# Patient Record
Sex: Female | Born: 1990 | Race: Black or African American | Hispanic: No | Marital: Single | Smoking: Never smoker
Health system: Southern US, Community
[De-identification: ages and names within clinical notes are randomized; demographics above are authoritative.]

---
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
## 2010-01-27 ENCOUNTER — Inpatient Hospital Stay (HOSPITAL_COMMUNITY): Admission: AD | Admit: 2010-01-27 | Discharge: 2010-01-27 | Payer: Self-pay | Admitting: Obstetrics and Gynecology

## 2010-01-29 ENCOUNTER — Inpatient Hospital Stay (HOSPITAL_COMMUNITY): Admission: AD | Admit: 2010-01-29 | Discharge: 2010-01-29 | Payer: Self-pay | Admitting: Obstetrics and Gynecology

## 2010-06-03 LAB — URINALYSIS, ROUTINE W REFLEX MICROSCOPIC
Bilirubin Urine: NEGATIVE
Hgb urine dipstick: NEGATIVE
Ketones, ur: NEGATIVE mg/dL
Protein, ur: NEGATIVE mg/dL
Urobilinogen, UA: 0.2 mg/dL (ref 0.0–1.0)

## 2010-06-03 LAB — ABO/RH: ABO/RH(D): O POS

## 2010-06-08 LAB — URINALYSIS, ROUTINE W REFLEX MICROSCOPIC
Nitrite: NEGATIVE
Specific Gravity, Urine: >1.03 — ABNORMAL HIGH (ref 1.005–1.030)
pH: 5.5 (ref 5.0–8.0)

## 2010-06-08 LAB — GC/CHLAMYDIA PROBE AMP, GENITAL
Chlamydia, DNA Probe: NEGATIVE
GC Probe Amp, Genital: NEGATIVE

## 2010-07-04 ENCOUNTER — Emergency Department (HOSPITAL_COMMUNITY): Payer: Federal, State, Local not specified - PPO

## 2010-07-04 ENCOUNTER — Emergency Department (HOSPITAL_COMMUNITY)
Admission: EM | Admit: 2010-07-04 | Discharge: 2010-07-04 | Disposition: A | Discharge status: Home / Self Care | Payer: Federal, State, Local not specified - PPO | Attending: Emergency Medicine | Admitting: Emergency Medicine

## 2010-07-04 DIAGNOSIS — J40 Bronchitis, not specified as acute or chronic: Secondary | ICD-10-CM | POA: Insufficient documentation

## 2010-07-04 DIAGNOSIS — R05 Cough: Secondary | ICD-10-CM | POA: Insufficient documentation

## 2010-07-04 DIAGNOSIS — R059 Cough, unspecified: Secondary | ICD-10-CM | POA: Insufficient documentation

## 2010-07-04 DIAGNOSIS — R0602 Shortness of breath: Secondary | ICD-10-CM | POA: Insufficient documentation

## 2010-07-04 DIAGNOSIS — R062 Wheezing: Secondary | ICD-10-CM | POA: Insufficient documentation

## 2010-11-17 LAB — OB RESULTS CONSOLE GC/CHLAMYDIA: Gonorrhea: NEGATIVE

## 2011-02-09 LAB — OB RESULTS CONSOLE ABO/RH: RH Type: POSITIVE

## 2011-02-09 LAB — OB RESULTS CONSOLE ANTIBODY SCREEN: Antibody Screen: NEGATIVE

## 2011-02-09 LAB — OB RESULTS CONSOLE HEPATITIS B SURFACE ANTIGEN: Hepatitis B Surface Ag: NEGATIVE

## 2011-03-24 NOTE — L&D Delivery Note (Signed)
Delivery Note At 1:02 AM a viable and healthy female was delivered via Vaginal, Spontaneous Delivery (Presentation: Right Occiput Anterior).  APGAR: 7, 9; weight .  6lb 13 oz Placenta status: Intact, Spontaneous Pathology for maternal temp  Cord: 3 vessels with the following complications: None.  Cord pH: none  Anesthesia: Epidural  Episiotomy: None Lacerations: None Suture Repair: n/a Est. Blood Loss (mL): 300  Mom to postpartum.  Baby to nursery-stable.  Lucia Harm A 12/09/2011, 1:55 AM

## 2011-05-11 ENCOUNTER — Emergency Department (INDEPENDENT_AMBULATORY_CARE_PROVIDER_SITE_OTHER)
Admission: EM | Admit: 2011-05-11 | Discharge: 2011-05-11 | Disposition: A | Discharge status: Home / Self Care | Payer: Federal, State, Local not specified - PPO | Source: Home / Self Care | Attending: Emergency Medicine | Admitting: Emergency Medicine

## 2011-05-11 ENCOUNTER — Encounter (HOSPITAL_COMMUNITY): Payer: Self-pay | Admitting: Emergency Medicine

## 2011-05-11 DIAGNOSIS — J069 Acute upper respiratory infection, unspecified: Secondary | ICD-10-CM

## 2011-05-11 DIAGNOSIS — J329 Chronic sinusitis, unspecified: Secondary | ICD-10-CM

## 2011-05-11 MED ORDER — AMOXICILLIN 500 MG PO CAPS: 1000.0000 mg | ORAL_CAPSULE | Freq: Three times a day (TID) | ORAL | Status: DC

## 2011-05-11 NOTE — ED Provider Notes (Signed)
Chief Complaint  Patient presents with  . Facial Pain  . Sinusitis    History of Present Illness:   The patient is a 21 year old female who is 2 months pregnant. She has had a five-day history of feeling achy all over and sore throat. This was then followed by and nasal congestion with clear drainage, facial, sinus, and tooth pain, and a dry cough. She denies fevers, chills, or sweats. No earache. No shortness of breath, chest pain, or difficulty breathing. She has not had any complications of her pregnancy.  Review of Systems:  Other than noted above, the patient denies any of the following symptoms. Systemic:  No fever, chills, sweats, fatigue, myalgias, headache, or anorexia. Eye:  No redness, pain or drainage. ENT:  No earache, nasal congestion, rhinorrhea, sinus pressure, or sore throat. Lungs:  No cough, sputum production, wheezing, shortness of breath. Or chest pain. GI:  No nausea, vomiting, abdominal pain or diarrhea. Skin:  No rash or itching.  PMFSH:  Past medical history, family history, social history, meds, and allergies were reviewed.  Physical Exam:   Vital signs:  BP 114/69  Pulse 98  Temp(Src) 98.3 F (36.8 C) (Oral)  Resp 14  SpO2 100% General:  Alert, in no distress. Eye:  No conjunctival injection or drainage. ENT:  TMs and canals were normal, without erythema or inflammation.  Nasal mucosa was clear and uncongested, without drainage.  Mucous membranes were moist.  Pharynx was clear, without exudate or drainage.  There were no oral ulcerations or lesions. Neck:  Supple, no adenopathy, tenderness or mass. Lungs:  No respiratory distress.  Lungs were clear to auscultation, without wheezes, rales or rhonchi.  Breath sounds were clear and equal bilaterally. Heart:  Regular rhythm, without gallops, murmers or rubs. Skin:  Clear, warm, and dry, without rash or lesions.  Radiology:  No results found.  Assessment:   Diagnoses that have been ruled out:  None    Diagnoses that are still under consideration:  None  Final diagnoses:  Upper respiratory infection  Sinusitis      Plan:   1.  The following meds were prescribed:   New Prescriptions   AMOXICILLIN (AMOXIL) 500 MG CAPSULE    Take 2 capsules (1,000 mg total) by mouth 3 (three) times daily.   2.  The patient was instructed in symptomatic care and handouts were given. 3.  The patient was told to return if becoming worse in any way, if no better in 3 or 4 days, and given some red flag symptoms that would indicate earlier return.   Roque Lias, MD 05/11/11 [phone]

## 2011-05-11 NOTE — ED Notes (Signed)
PT HERE WITH SINUS INFECTION  FACIAL PAIN ,PRESSURE,THROB H/A AND NASAL CONGESTION THAT STARTED LAST THURSDAY.PT HAS TAKEN TAMIFLU BUT NO SINUS MEDS.PT IS [redacted] WEEKS PREGNANT AND TAKE PRENATAL VITAMINS ONLY

## 2011-05-11 NOTE — Discharge Instructions (Signed)
May take Afrin Spray and Claritin tablets (over the counter).Saline Nose Drops  To help clear a stuffy nose, put salt water (saline) nose drops in your infant's nose. This helps to loosen the secretions in the nose. Use a bulb syringe to clean the nose out:  Before feeding.   Before putting your infant down for naps.   No more than once every 3 hours to avoid irritating your infant's nostrils.  HOME CARE  Buy nose drops at your local drug store. You can also make nose drops yourself. Mix 1 cup of water with  teaspoon of salt. Stir. Store this mixture at room temperature. Make a new batch daily.   To use the drops:   Put 1 or 2 drops in each side of infant's nose with a clean medicine dropper. Do not use this dropper for any other medicine.   Squeeze the air out of the suction bulb before inserting it into your infant's nose.   While still squeezing the bulb flat, place the tip of the bulb into a nostril. Let air come back into the bulb. The suction will pull snot out of the nose and into the bulb.   Repeat on other nostril.   Squeeze the bulb several times into a tissue and wash the bulb tip in soapy water. Store the bulb with the tip side down on paper towel.   Use the bulb syringe with only the saline drops to avoid irritating your infant's nostrils.  GET HELP RIGHT AWAY IF:  The snot changes to green or yellow.   The snot gets thicker.   Your infant is 3 months or younger with a rectal temperature of 100.4 F (38 C) or higher.   Your infant is older than 3 months with a rectal temperature of 102 F (38.9 C) or higher.   The stuffy nose lasts 10 days or longer.   There is trouble breathing or feeding.  MAKE SURE YOU:  Understand these instructions.   Will watch your infant's condition.   Will get help right away if your infant is not doing well or gets worse.  Document Released: 01/04/2009 Document Revised: 11/19/2010 Document Reviewed: 01/04/2009 [facility] Patient  Information 2012 [place], [place].

## 2011-05-19 ENCOUNTER — Encounter (HOSPITAL_COMMUNITY): Payer: Self-pay

## 2011-05-19 ENCOUNTER — Inpatient Hospital Stay (HOSPITAL_COMMUNITY)
Admission: AD | Admit: 2011-05-19 | Discharge: 2011-05-19 | Disposition: A | Discharge status: Home Health | Payer: Federal, State, Local not specified - PPO | Source: Ambulatory Visit | Attending: Obstetrics and Gynecology | Admitting: Obstetrics and Gynecology

## 2011-05-19 DIAGNOSIS — O200 Threatened abortion: Secondary | ICD-10-CM | POA: Insufficient documentation

## 2011-05-19 NOTE — Progress Notes (Signed)
Patient is here with c/o Bhalla vaginal discharge that she notices after bowel movement. She states that the blood isn't from the rectum. She was evaluated about a week ago and was told that she is okay. She denies any pain. Her first ob appt is Thursday.

## 2011-05-19 NOTE — ED Provider Notes (Signed)
History   21 yo G2P0010 BF now @ [redacted] wk gestation presents for evaluation of Staszewski vaginal discharge. Pt denies abd cramping. Seen ion [place] last thur and had sonogram which showed some separation but c/w gestational age  Chief Complaint  Patient presents with  . Vaginal Bleeding     OB History    Grav Para Term Preterm Abortions TAB SAB Ect Mult Living   2    1  1          History reviewed. No pertinent past medical history.  History reviewed. No pertinent past surgical history.  History reviewed. No pertinent family history.  History  Substance Use Topics  . Smoking status: Never Smoker   . Smokeless tobacco: Not on file  . Alcohol Use: No    Allergies: No Known Allergies  Prescriptions prior to admission  Medication Sig Dispense Refill  . Prenatal Vit-Fe Fumarate-FA (PRENATAL MULTIVITAMIN) TABS Take 1 tablet by mouth daily.         Physical Exam   Blood pressure 122/64, pulse 101, temperature 97.1 F (36.2 C), temperature source Oral, resp. rate 16, height 5\' 11"  (1.803 m), weight 153 lb 6 oz (69.57 kg).  General appearance: alert, cooperative and no distress Abdomen: soft, non-tender; bowel sounds normal; no masses,  no organomegaly Pelvic: 10 wk AV, cervix firm closed/long. scant pinkish discharge Extremities: no edema, redness or tenderness in the calves or thighs  Bedside sono: (+) active fetus z(+) FHr doppler 185   ED Course  A/P) Threatened ab  P) threatened Ab prec. Keep OB appt thur. Abstain from intercourse MDM   Latora Quarry A, MD 6:27 PM 05/19/2011      21 yo G2P00

## 2011-06-16 IMAGING — US US OB COMP LESS 14 WK
1 series · 14 of 28 positions shown · non-contrast
Comparison: None.

CLINICAL DATA: Pelvic pain

OBSTETRIC <14 WK US AND TRANSVAGINAL OB US
TECHNIQUE: Both transabdominal and transvaginal ultrasound
examinations were performed for complete evaluation of the
gestation as well as the maternal uterus, adnexal regions, and
pelvic cul-de-sac.  Transvaginal technique was performed to assess
early pregnancy.

[Series 1: us ob comp less 14 wks · 0.20mm/px · 14 of 43 slices shown]
[im 2/43]
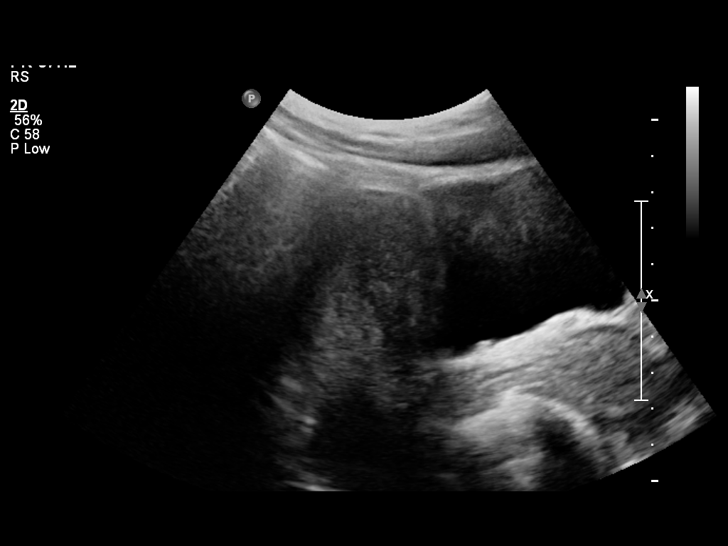
[im 5/43]
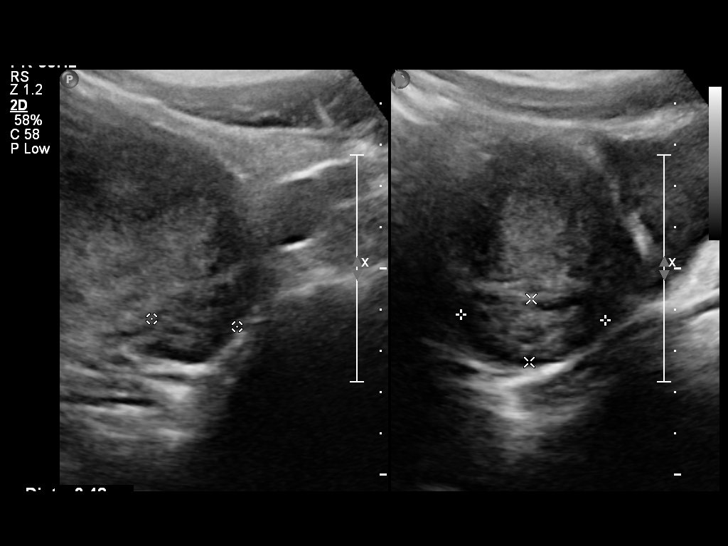
[im 8/43]
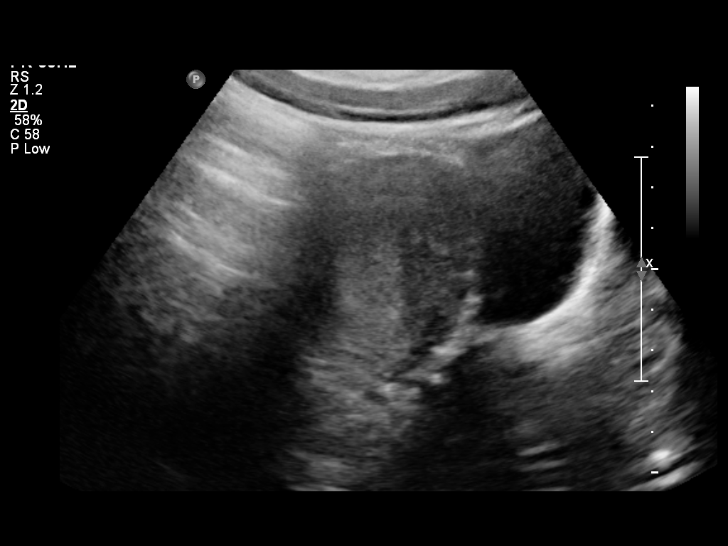
[im 11/43]
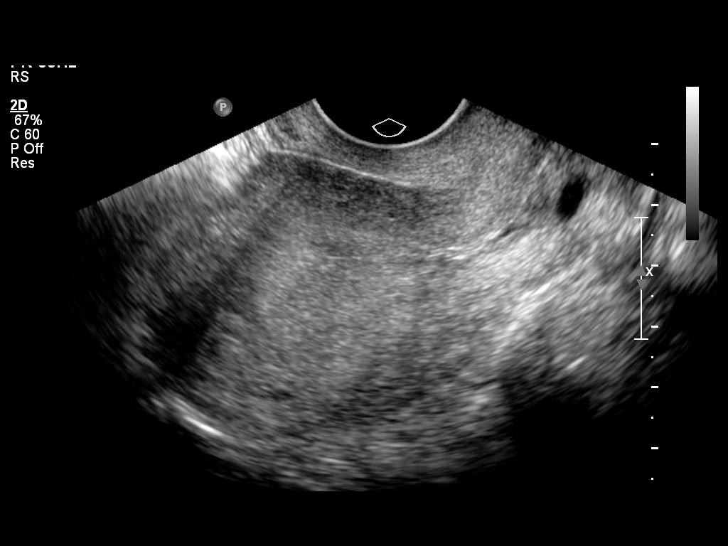
[im 15/43]
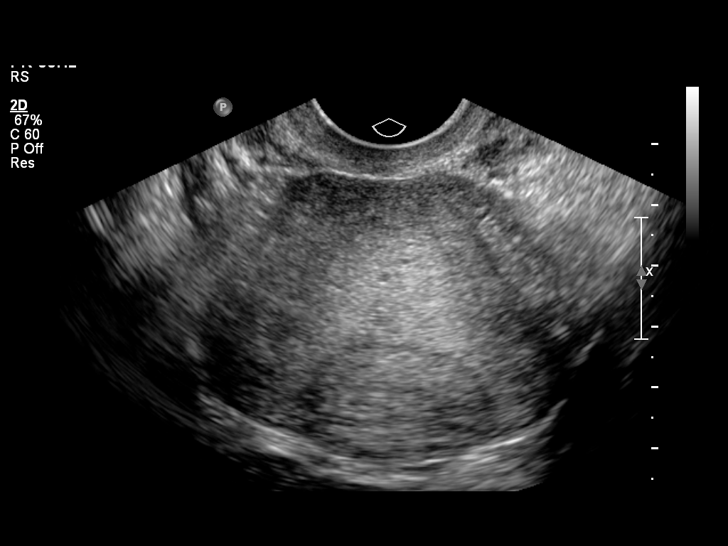
[im 18/43]
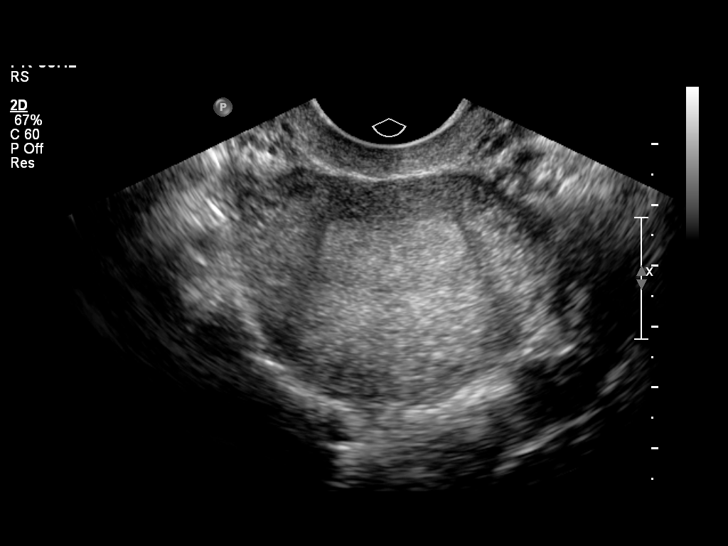
[im 21/43]
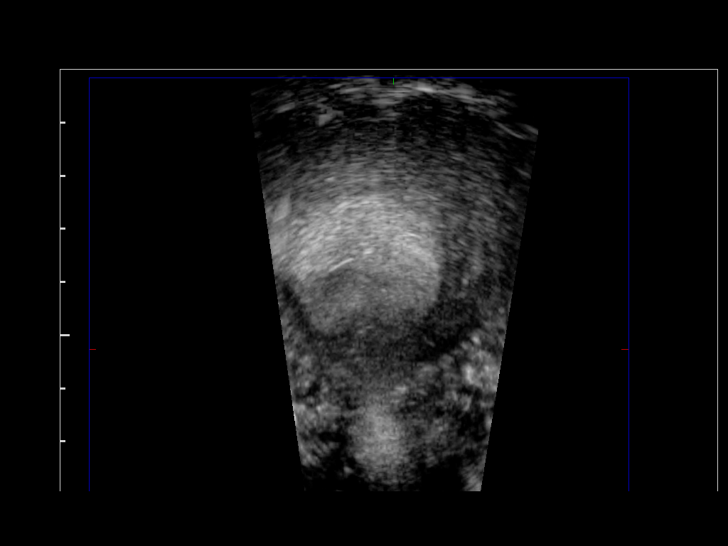
[im 24/43]
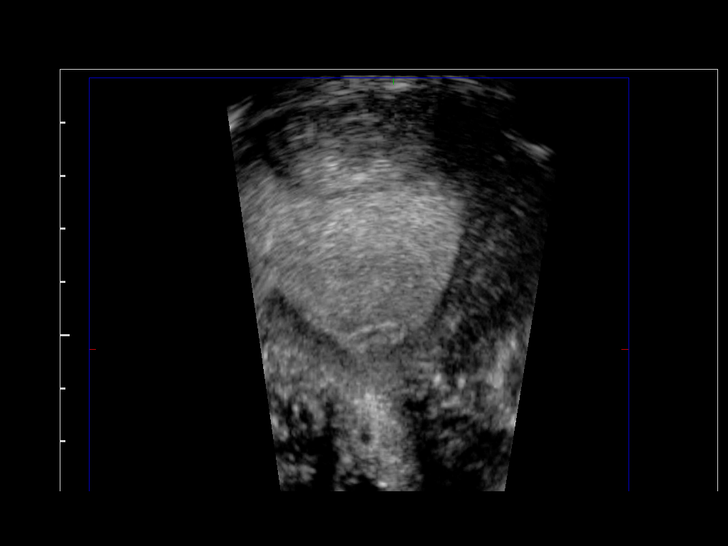
[im 27/43]
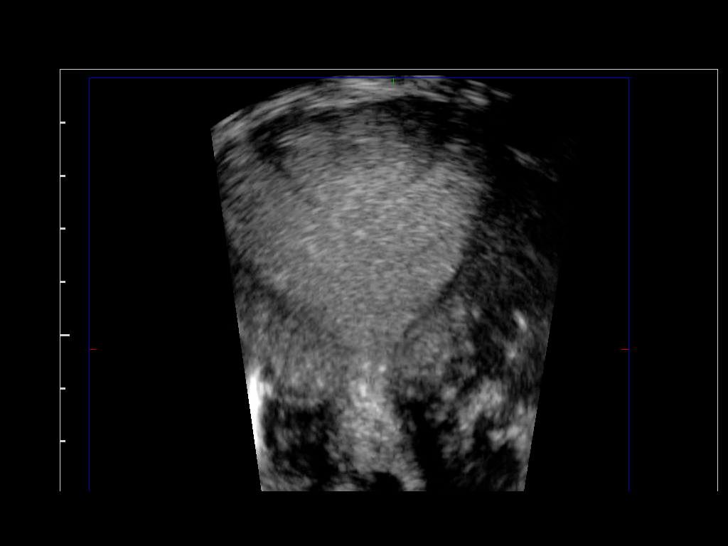
[im 30/43]
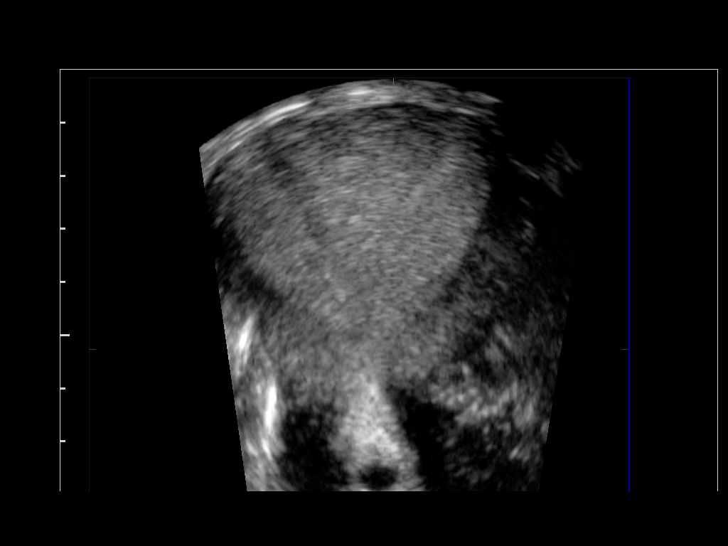
[im 33/43]
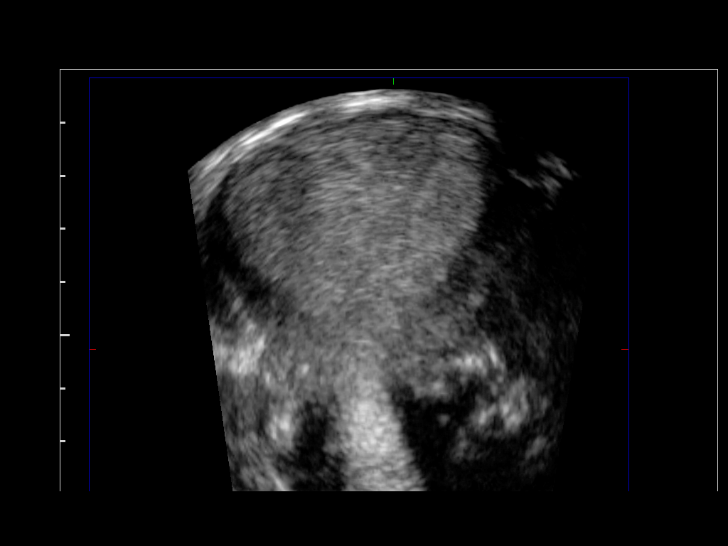
[im 36/43]
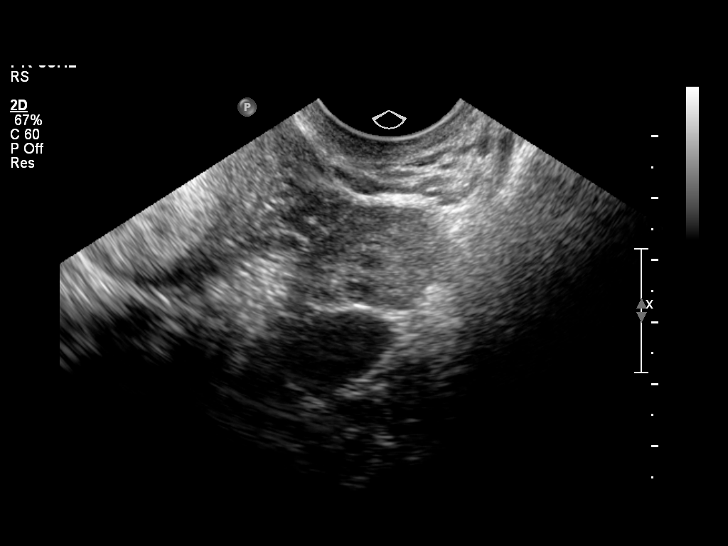
[im 39/43]
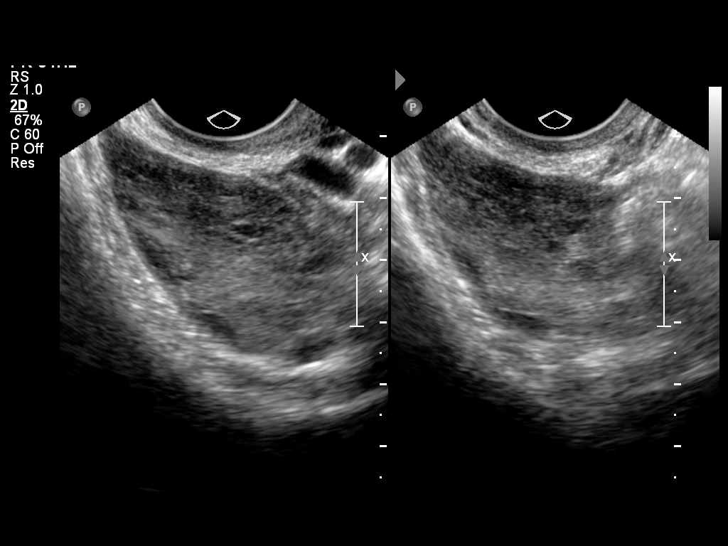
[im 43/43]
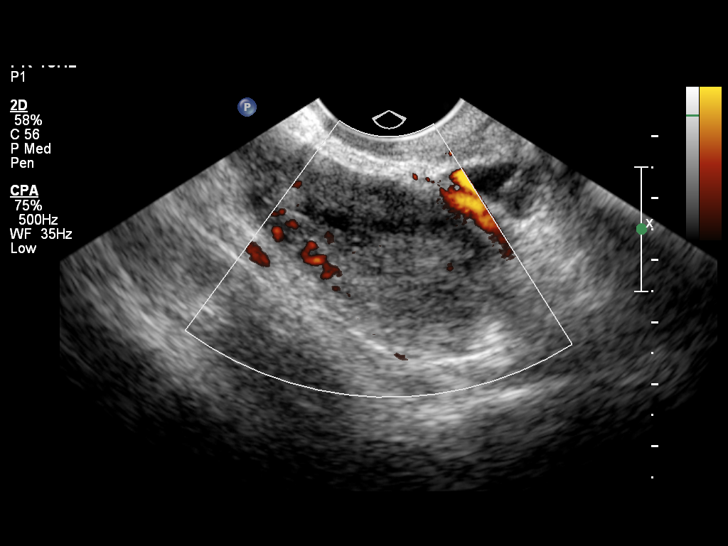

[14 of 28 positions shown; findings below may reference images not displayed]

Intrauterine gestational sac:  Absent
Yolk sac: Absent
Embryo: Absent
Cardiac Activity: Not applicable
Heart Rate: Not applicable bpm

Maternal uterus/adnexae:
Ovaries are within normal limits.  Septate uterus is noted on
coronal imaging.  No free fluid.  No pelvic mass.
IMPRESSION: No intrauterine gestational sac is identified.  Differential
diagnosis includes normal early pregnancy, spontaneous abortion,
and ectopic pregnancy.  Serial beta HCG levels are recommended.

Septate uterus.

## 2011-11-21 IMAGING — CR DG CHEST 2V
2 series · 2 of 2 positions shown · non-contrast
Comparison: None.

CLINICAL DATA: Shortness of breath.

CHEST - 2 VIEW

[w chest pa]
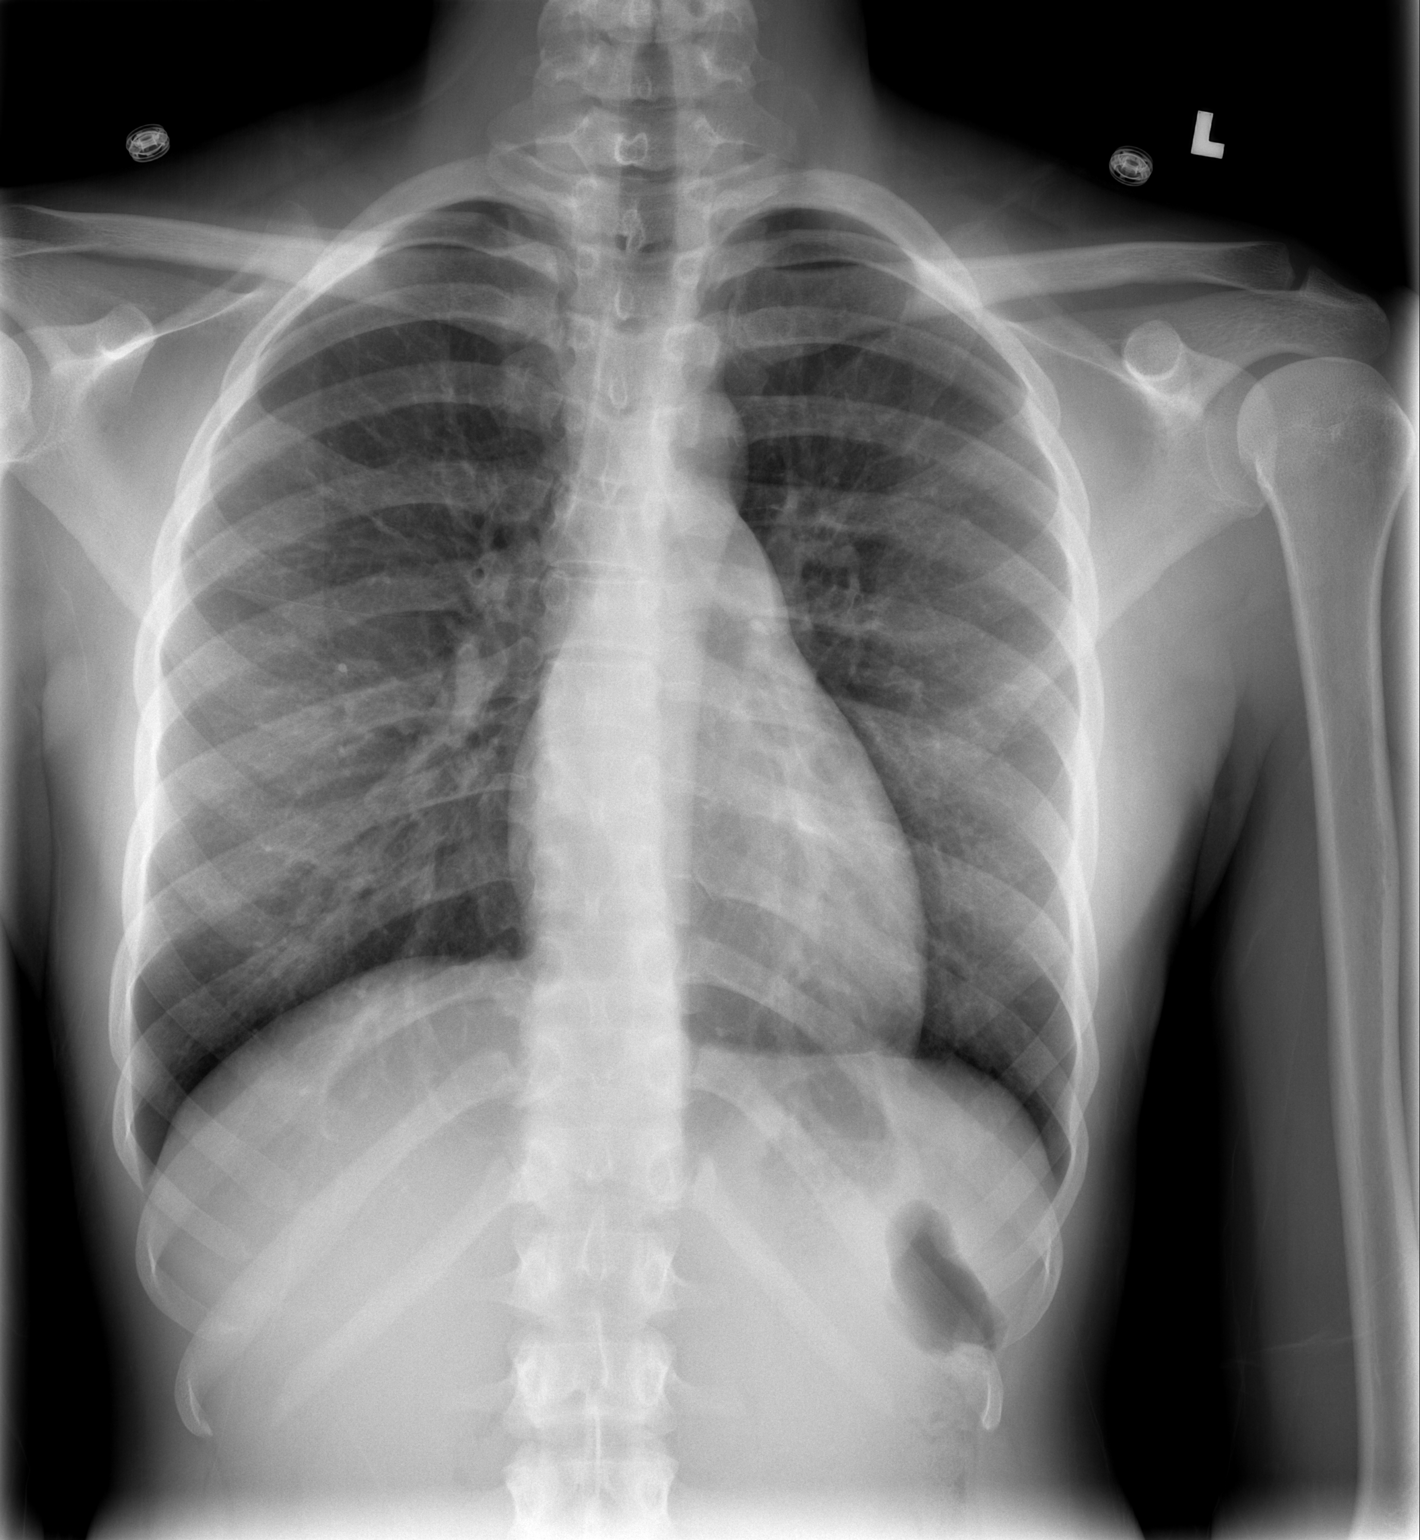

[w chest lat]
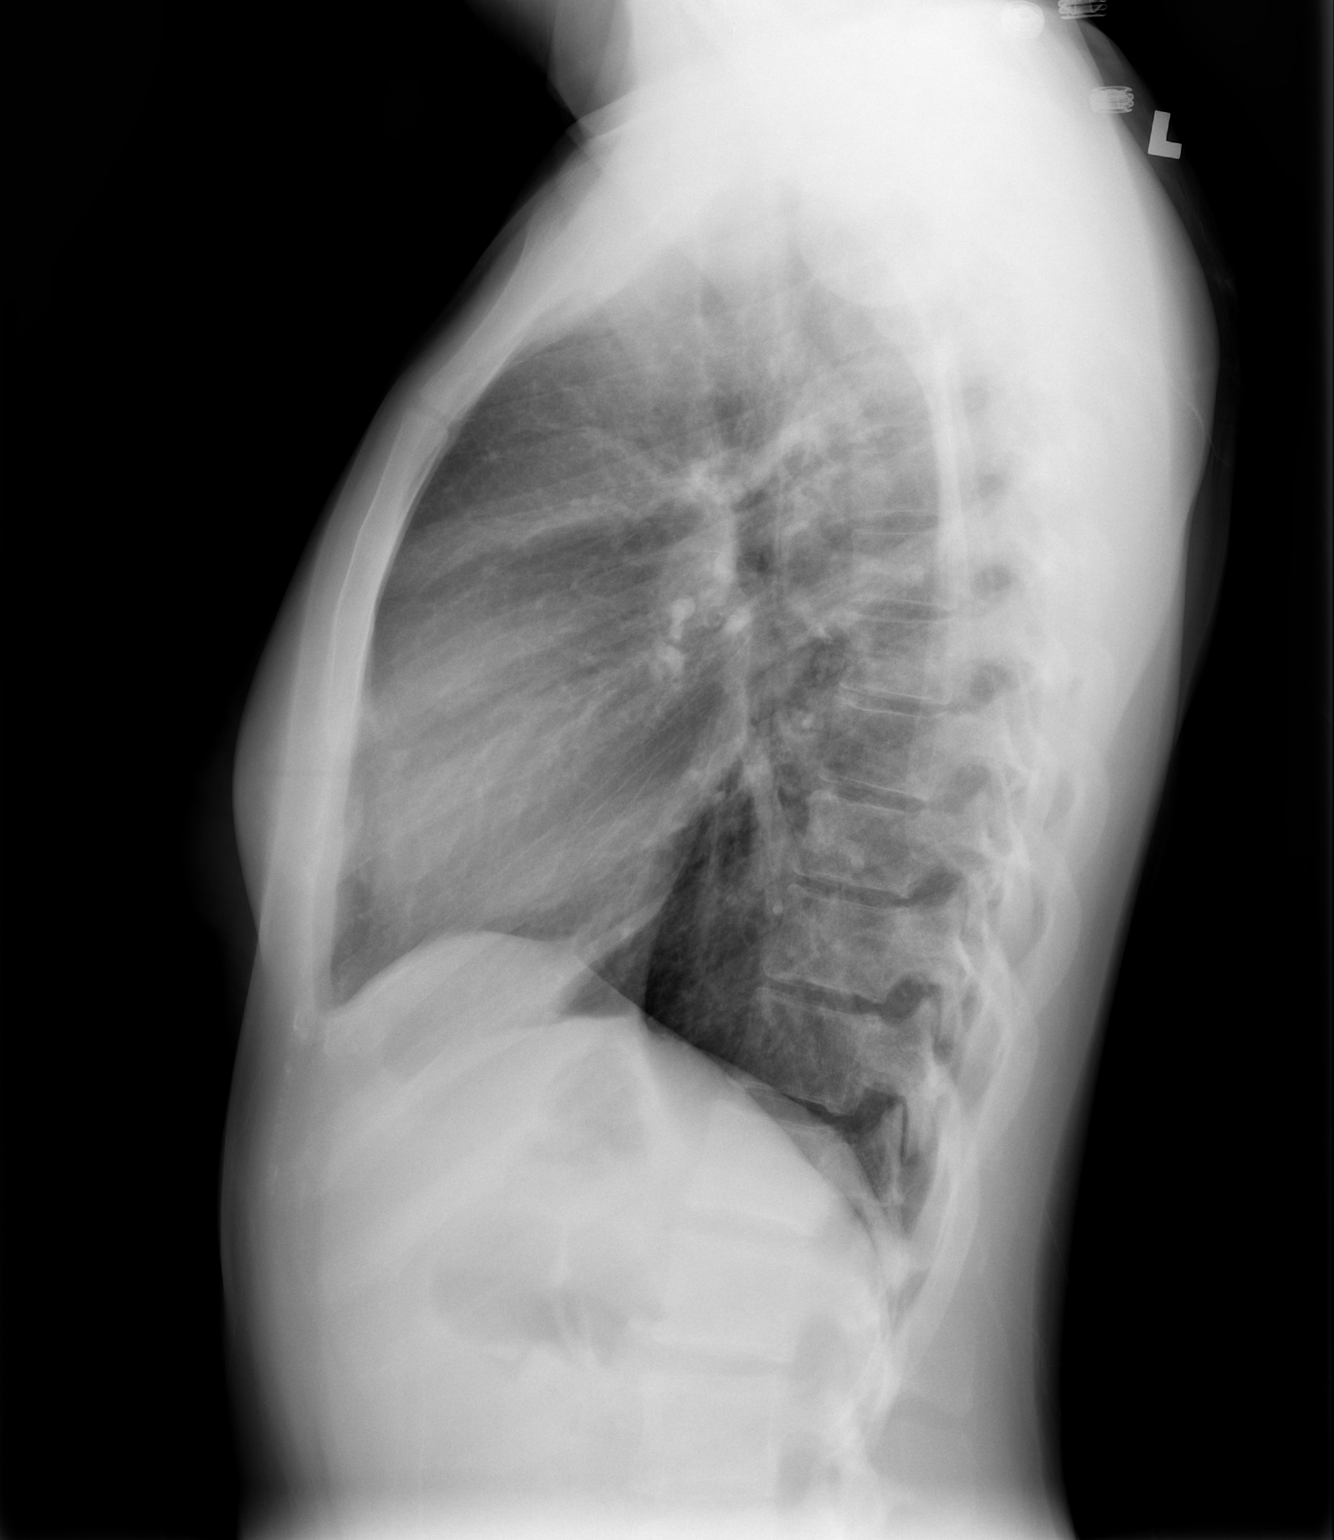

[2 of 2 positions shown; findings below may reference images not displayed]

FINDINGS: The lungs are well-aerated.  Mild peribronchial
thickening is noted.  There is no evidence of focal opacification,
pleural effusion or pneumothorax.

The heart is normal in size; the mediastinal contour is within
normal limits.  No acute osseous abnormalities are seen.
IMPRESSION: Mild peribronchial thickening noted; lungs otherwise clear.

## 2011-12-06 ENCOUNTER — Inpatient Hospital Stay (HOSPITAL_COMMUNITY)
Admission: AD | Admit: 2011-12-06 | Discharge: 2011-12-06 | Disposition: A | Discharge status: Home / Self Care | Payer: Federal, State, Local not specified - PPO | Source: Ambulatory Visit | Attending: Obstetrics and Gynecology | Admitting: Obstetrics and Gynecology

## 2011-12-06 ENCOUNTER — Encounter (HOSPITAL_COMMUNITY): Payer: Self-pay | Admitting: *Deleted

## 2011-12-06 DIAGNOSIS — O479 False labor, unspecified: Secondary | ICD-10-CM | POA: Insufficient documentation

## 2011-12-06 NOTE — MAU Provider Note (Signed)
History     Chief Complaint  Patient presents with  . Contractions    21 yo G1P0 SBF now @ 38 5/7 weeks presents for evaluation of ctx. Intact membrane  OB History    Grav Para Term Preterm Abortions TAB SAB Ect Mult Living   2    1  1    0      History reviewed. No pertinent past medical history.  History reviewed. No pertinent past surgical history.  History reviewed. No pertinent family history.  History  Substance Use Topics  . Smoking status: Never Smoker   . Smokeless tobacco: Not on file  . Alcohol Use: No    Allergies: No Known Allergies  Prescriptions prior to admission  Medication Sig Dispense Refill  . Prenatal Vit-Fe Fumarate-FA (PRENATAL MULTIVITAMIN) TABS Take 1 tablet by mouth daily.       Tracing reactive baseline  Physical Exam   Blood pressure 125/80, pulse 70, temperature 97.5 F (36.4 C), temperature source Oral, resp. rate 20, height 5\' 11"  (1.803 m), weight 83.008 kg (183 lb).  Abdomen: gravid nontender Pelvic: external genitalia normal and posterior/ 50%/pp oop/ext os FT ED Course   MDM No evidence of labor IUP @ 38 5/7 week P) d/c home. Labor prec f/u 9/17  Ryzen Deady A, MD 8:41 AM 12/06/2011

## 2011-12-06 NOTE — MAU Note (Signed)
PT SAYS  STARTED HURTING  AT [phone]  WAS FT IN OFFICE ON Tuesday.  DENIES HSV AND MRSA

## 2011-12-06 NOTE — MAU Note (Signed)
I've had regular contractions since this morning. Started timing them at 0450. No leaking or bleeding

## 2011-12-06 NOTE — Progress Notes (Signed)
Dr. Cherly Hensen notified of cervical exam performed by Debra RN, fingertip- difficult exam due to patients comfort level. Dr. Cherly Hensen on her way in to see patient and examine her.

## 2011-12-08 ENCOUNTER — Encounter (HOSPITAL_COMMUNITY): Payer: Self-pay

## 2011-12-08 ENCOUNTER — Inpatient Hospital Stay (HOSPITAL_COMMUNITY)
Admission: AD | Admit: 2011-12-08 | Discharge: 2011-12-10 | DRG: 372 | Disposition: A | Discharge status: Home / Self Care | Payer: Federal, State, Local not specified - PPO | Source: Ambulatory Visit | Attending: Obstetrics and Gynecology | Admitting: Obstetrics and Gynecology

## 2011-12-08 ENCOUNTER — Encounter (HOSPITAL_COMMUNITY): Payer: Self-pay | Admitting: Anesthesiology

## 2011-12-08 ENCOUNTER — Inpatient Hospital Stay (HOSPITAL_COMMUNITY): Payer: Federal, State, Local not specified - PPO | Admitting: Anesthesiology

## 2011-12-08 DIAGNOSIS — O324XX0 Maternal care for high head at term, not applicable or unspecified: Secondary | ICD-10-CM | POA: Diagnosis present

## 2011-12-08 DIAGNOSIS — O9903 Anemia complicating the puerperium: Secondary | ICD-10-CM | POA: Diagnosis not present

## 2011-12-08 DIAGNOSIS — Z23 Encounter for immunization: Secondary | ICD-10-CM

## 2011-12-08 DIAGNOSIS — D62 Acute posthemorrhagic anemia: Secondary | ICD-10-CM | POA: Diagnosis not present

## 2011-12-08 LAB — CBC
HCT: 38.4 % (ref 36.0–46.0)
MCV: 85 fL (ref 78.0–100.0)
RBC: 4.52 MIL/uL (ref 3.87–5.11)
WBC: 8.3 10*3/uL (ref 4.0–10.5)

## 2011-12-08 MED ORDER — EPHEDRINE 5 MG/ML INJ
10.0000 mg | INTRAVENOUS | Status: DC | PRN
  Filled 2011-12-08: qty 4

## 2011-12-08 MED ORDER — LACTATED RINGERS IV SOLN
500.0000 mL | INTRAVENOUS | Status: DC | PRN
  Administered 2011-12-08: 1000 mL via INTRAVENOUS

## 2011-12-08 MED ORDER — OXYCODONE-ACETAMINOPHEN 5-325 MG PO TABS: 1.0000 | ORAL_TABLET | ORAL | Status: DC | PRN

## 2011-12-08 MED ORDER — LACTATED RINGERS IV SOLN: 500.0000 mL | Freq: Once | INTRAVENOUS | Status: DC

## 2011-12-08 MED ORDER — ACETAMINOPHEN 325 MG PO TABS: 650.0000 mg | ORAL_TABLET | ORAL | Status: DC | PRN

## 2011-12-08 MED ORDER — OXYTOCIN 40 UNITS IN LACTATED RINGERS INFUSION - SIMPLE MED
1.0000 m[IU]/min | INTRAVENOUS | Status: DC
  Administered 2011-12-08: 2 m[IU]/min via INTRAVENOUS
  Filled 2011-12-08: qty 1000

## 2011-12-08 MED ORDER — SODIUM BICARBONATE 8.4 % IV SOLN
INTRAVENOUS | Status: DC | PRN
  Administered 2011-12-08: 4 mL via EPIDURAL

## 2011-12-08 MED ORDER — IBUPROFEN 600 MG PO TABS
600.0000 mg | ORAL_TABLET | Freq: Four times a day (QID) | ORAL | Status: DC | PRN
  Filled 2011-12-08: qty 1

## 2011-12-08 MED ORDER — PHENYLEPHRINE 40 MCG/ML (10ML) SYRINGE FOR IV PUSH (FOR BLOOD PRESSURE SUPPORT): 80.0000 ug | PREFILLED_SYRINGE | INTRAVENOUS | Status: DC | PRN

## 2011-12-08 MED ORDER — LACTATED RINGERS IV SOLN
INTRAVENOUS | Status: DC
  Administered 2011-12-08 (×2): via INTRAVENOUS

## 2011-12-08 MED ORDER — ONDANSETRON HCL 4 MG/2ML IJ SOLN: 4.0000 mg | Freq: Four times a day (QID) | INTRAMUSCULAR | Status: DC | PRN

## 2011-12-08 MED ORDER — FENTANYL 2.5 MCG/ML BUPIVACAINE 1/10 % EPIDURAL INFUSION (WH - ANES)
INTRAMUSCULAR | Status: DC | PRN
  Administered 2011-12-08: 14 mL/h via EPIDURAL

## 2011-12-08 MED ORDER — FLEET ENEMA 7-19 GM/118ML RE ENEM: 1.0000 | ENEMA | RECTAL | Status: DC | PRN

## 2011-12-08 MED ORDER — EPHEDRINE 5 MG/ML INJ: 10.0000 mg | INTRAVENOUS | Status: DC | PRN

## 2011-12-08 MED ORDER — FENTANYL 2.5 MCG/ML BUPIVACAINE 1/10 % EPIDURAL INFUSION (WH - ANES)
14.0000 mL/h | INTRAMUSCULAR | Status: DC
  Administered 2011-12-08 (×3): 14 mL/h via EPIDURAL
  Filled 2011-12-08 (×4): qty 60

## 2011-12-08 MED ORDER — DIPHENHYDRAMINE HCL 50 MG/ML IJ SOLN
12.5000 mg | INTRAMUSCULAR | Status: DC | PRN
  Administered 2011-12-08: 12.5 mg via INTRAVENOUS
  Filled 2011-12-08: qty 1

## 2011-12-08 MED ORDER — CITRIC ACID-SODIUM CITRATE 334-500 MG/5ML PO SOLN: 30.0000 mL | ORAL | Status: DC | PRN

## 2011-12-08 MED ORDER — LIDOCAINE HCL (PF) 1 % IJ SOLN
30.0000 mL | INTRAMUSCULAR | Status: DC | PRN
  Administered 2011-12-09: 30 mL via SUBCUTANEOUS
  Filled 2011-12-08: qty 30

## 2011-12-08 MED ORDER — OXYTOCIN 40 UNITS IN LACTATED RINGERS INFUSION - SIMPLE MED: 62.5000 mL/h | Freq: Once | INTRAVENOUS | Status: DC

## 2011-12-08 MED ORDER — PHENYLEPHRINE 40 MCG/ML (10ML) SYRINGE FOR IV PUSH (FOR BLOOD PRESSURE SUPPORT)
80.0000 ug | PREFILLED_SYRINGE | INTRAVENOUS | Status: DC | PRN
  Filled 2011-12-08: qty 5

## 2011-12-08 MED ORDER — OXYTOCIN BOLUS FROM INFUSION
500.0000 mL | Freq: Once | INTRAVENOUS | Status: AC
  Administered 2011-12-09: 500 mL via INTRAVENOUS
  Filled 2011-12-08: qty 500

## 2011-12-08 NOTE — Anesthesia Preprocedure Evaluation (Signed)

## 2011-12-08 NOTE — H&P (Signed)
Tanya Huffman is a 21 y.o. female presenting for admission due to labor. Intact perineum. Seen in office where exam showed 4/80/-2 posterior  Maternal Medical History:  Reason for admission: Reason for admission: contractions.  Contractions: Onset was yesterday.   Frequency: regular.   Perceived severity is moderate.    Fetal activity: Perceived fetal activity is normal.    Prenatal complications: no prenatal complications Prenatal Complications - Diabetes: none.    OB History    Grav Para Term Preterm Abortions TAB SAB Ect Mult Living   2    1  1    0     Past Medical History  Diagnosis Date  . Normal labor 12/08/2011   No past surgical history on file. Family History: family history is not on file. Social History:  reports that she has never smoked. She does not have any smokeless tobacco history on file. She reports that she does not drink alcohol or use illicit drugs.   Prenatal Transfer Tool  Maternal Diabetes: No Genetic Screening: Normal Maternal Ultrasounds/Referrals: Normal Fetal Ultrasounds or other Referrals:  None Maternal Substance Abuse:  No Significant Maternal Medications:  None Significant Maternal Lab Results:  Lab values include: Group B Strep negative Other Comments:  None  Review of Systems  All other systems reviewed and are negative.    Dilation: 4 Exam by::  (Office) Blood pressure 121/74, pulse 60, temperature 97.7 F (36.5 C), temperature source Oral, resp. rate 18, height 5\' 11"  (1.803 m), weight 83.462 kg (184 lb), SpO2 100.00%. Exam Physical Exam  Constitutional: She is oriented to person, place, and time. She appears well-developed and well-nourished.  HENT:  Head: Atraumatic.  Eyes: EOM are normal.  Neck: Neck supple.  Cardiovascular: Regular rhythm.   Respiratory: Breath sounds normal.  GI: Soft.  Musculoskeletal: She exhibits no edema.  Neurological: She is alert and oriented to person, place, and time.  Skin: Skin is warm  and dry.  Psychiatric: She has a normal mood and affect.    Prenatal labs: ABO, Rh: O/Positive/-- (11/19 0000) Antibody: Negative (11/19 0000) Rubella: Immune (11/19 0000) RPR: NON REACTIVE (09/17 1045)  HBsAg: Negative (11/19 0000)  HIV: Non-reactive (07/02 0000)  GBS: Negative (08/26 0000)   Assessment/Plan: Term gestation Active phase P) admit routine labs. Epidural. Pitocin/amniotomy prn   Darwin Guastella A 12/08/2011, 3:06 PM

## 2011-12-08 NOTE — Anesthesia Procedure Notes (Signed)
Epidural Patient location during procedure: OB  Preanesthetic Checklist Completed: patient identified, site marked, surgical consent, pre-op evaluation, timeout performed, IV checked, risks and benefits discussed and monitors and equipment checked  Epidural Patient position: sitting Prep: site prepped and draped and DuraPrep Patient monitoring: continuous pulse ox and blood pressure Approach: midline Injection technique: LOR air  Needle:  Needle type: Tuohy  Needle gauge: 17 G Needle length: 9 cm and 9 Needle insertion depth: 5 cm cm Catheter type: closed end flexible Catheter size: 19 Gauge Catheter at skin depth: 10 cm Test dose: negative  Assessment Events: blood not aspirated, injection not painful, no injection resistance, negative IV test and no paresthesia  Additional Notes Dosing of Epidural:  1st dose, through needle ............................................. epi 1:200K + Xylocaine 40 mg  2nd dose, through catheter, after waiting 3 minutes.....epi 1:200K + Xylocaine 40 mg  3rd dose, through catheter after waiting 3 minutes .............................Marcaine   4mg   ( mg Marcaine are expressed as equivilent  cc's medication removed from the 0.1%Bupiv / fentanyl syringe from L&D pump)  ( 2% Xylo charted as a single dose in Epic Meds for ease of charting; actual dosing was fractionated as above, for saftey's sake)  As each dose occurred, patient was free of IV sx; and patient exhibited no evidence of SA injection.  Patient is more comfortable after epidural dosed. Please see RN's note for documentation of vital signs,and FHR which are stable.  Patient reminded not to try to ambulate with numb legs, and that an RN must be present the 1st time she attempts to get up.    

## 2011-12-08 NOTE — Progress Notes (Signed)
S: epidural  VE: 5-6/90/-2/-1 AROM clear fluid . IUPC placed  Tracing: baseline 140 (+) accels Ctx q 2-4 mins  IMP: Active phase Poss protracted  P )  If no change in one hour post rupture, start pitocin. Exaggerated left position

## 2011-12-08 NOTE — Plan of Care (Signed)
Problem: Consults Goal: Birthing Suites Patient Information Press F2 to bring up selections list   Pt 37-[redacted] weeks EGA     

## 2011-12-09 ENCOUNTER — Encounter (HOSPITAL_COMMUNITY): Payer: Self-pay | Admitting: *Deleted

## 2011-12-09 LAB — CBC
Hemoglobin: 10.3 g/dL — ABNORMAL LOW (ref 12.0–15.0)
MCH: 27.8 pg (ref 26.0–34.0)
MCHC: 33 g/dL (ref 30.0–36.0)
Platelets: 156 10*3/uL (ref 150–400)
RDW: 13.5 % (ref 11.5–15.5)

## 2011-12-09 MED ORDER — ONDANSETRON HCL 4 MG/2ML IJ SOLN: 4.0000 mg | INTRAMUSCULAR | Status: DC | PRN

## 2011-12-09 MED ORDER — IBUPROFEN 600 MG PO TABS
600.0000 mg | ORAL_TABLET | Freq: Four times a day (QID) | ORAL | Status: DC
  Administered 2011-12-09 – 2011-12-10 (×5): 600 mg via ORAL
  Filled 2011-12-09 (×4): qty 1

## 2011-12-09 MED ORDER — DIBUCAINE 1 % RE OINT: 1.0000 "application " | TOPICAL_OINTMENT | RECTAL | Status: DC | PRN

## 2011-12-09 MED ORDER — SENNOSIDES-DOCUSATE SODIUM 8.6-50 MG PO TABS
2.0000 | ORAL_TABLET | Freq: Every day | ORAL | Status: DC
  Administered 2011-12-09: 2 via ORAL

## 2011-12-09 MED ORDER — TETANUS-DIPHTH-ACELL PERTUSSIS 5-2.5-18.5 LF-MCG/0.5 IM SUSP
0.5000 mL | Freq: Once | INTRAMUSCULAR | Status: AC
  Administered 2011-12-10: 0.5 mL via INTRAMUSCULAR
  Filled 2011-12-09 (×2): qty 0.5

## 2011-12-09 MED ORDER — ONDANSETRON HCL 4 MG PO TABS: 4.0000 mg | ORAL_TABLET | ORAL | Status: DC | PRN

## 2011-12-09 MED ORDER — LANOLIN HYDROUS EX OINT: TOPICAL_OINTMENT | CUTANEOUS | Status: DC | PRN

## 2011-12-09 MED ORDER — PRENATAL MULTIVITAMIN CH
1.0000 | ORAL_TABLET | Freq: Every day | ORAL | Status: DC
  Administered 2011-12-09 – 2011-12-10 (×2): 1 via ORAL
  Filled 2011-12-09 (×2): qty 1

## 2011-12-09 MED ORDER — WITCH HAZEL-GLYCERIN EX PADS: 1.0000 "application " | MEDICATED_PAD | CUTANEOUS | Status: DC | PRN

## 2011-12-09 MED ORDER — DIPHENHYDRAMINE HCL 25 MG PO CAPS: 25.0000 mg | ORAL_CAPSULE | Freq: Four times a day (QID) | ORAL | Status: DC | PRN

## 2011-12-09 MED ORDER — INFLUENZA VIRUS VACC SPLIT PF IM SUSP
0.5000 mL | INTRAMUSCULAR | Status: AC
  Administered 2011-12-10: 0.5 mL via INTRAMUSCULAR
  Filled 2011-12-09: qty 0.5

## 2011-12-09 MED ORDER — BENZOCAINE-MENTHOL 20-0.5 % EX AERO
1.0000 "application " | INHALATION_SPRAY | CUTANEOUS | Status: DC | PRN
  Filled 2011-12-09: qty 56

## 2011-12-09 MED ORDER — SIMETHICONE 80 MG PO CHEW: 80.0000 mg | CHEWABLE_TABLET | ORAL | Status: DC | PRN

## 2011-12-09 MED ORDER — ZOLPIDEM TARTRATE 5 MG PO TABS: 5.0000 mg | ORAL_TABLET | Freq: Every evening | ORAL | Status: DC | PRN

## 2011-12-09 MED ORDER — OXYCODONE-ACETAMINOPHEN 5-325 MG PO TABS
1.0000 | ORAL_TABLET | ORAL | Status: DC | PRN
  Administered 2011-12-09: 1 via ORAL
  Administered 2011-12-10: 2 via ORAL
  Filled 2011-12-09 (×2): qty 1
  Filled 2011-12-09: qty 2

## 2011-12-09 NOTE — Progress Notes (Signed)
S: Epidural Rectal pressure  O: VE: fully/(+) caput w/ right asynclytic posterior presentation (+1/+2) station  Tracing" baseline 160 (+) variability Ctx q 2 mins  Pitocin  IMP: Arrest of descent due to malpresentation P) d/c pushing. Left exaggerated sims position for at least an hour to rotate position. IVF bolus

## 2011-12-09 NOTE — Progress Notes (Signed)
S: epidural  O: VSS Afebrile Tracing reviewed. Baseline 150's min variability VE per RN fully (+) 2 station  IMP: Complete  P) start pushing

## 2011-12-09 NOTE — Anesthesia Postprocedure Evaluation (Signed)
  Anesthesia Post Note  Patient: Tanya Huffman  Procedure(s) Performed: * No procedures listed *  Anesthesia type: Epidural  Patient location: Mother/Baby  Post pain: Pain level controlled  Post assessment: Post-op Vital signs reviewed  Last Vitals:  Filed Vitals:   12/09/11 0740  BP: 117/73  Pulse: 60  Temp: 36.7 C  Resp: 16    Post vital signs: Reviewed  Level of consciousness:alert  Complications: No apparent anesthesia complications

## 2011-12-09 NOTE — Progress Notes (Signed)
PPD 1 SVD  S:  Reports feeling well, mild cramping.             Tolerating po/ No nausea or vomiting             Bleeding is moderate             Pain controlled with Motrin             Up ad lib / ambulatory / voiding well   Newborn  Information for the patient's newborn:  Katya, Rolston [[id number]]  female  breast feeding  / Circumcision planned   O:  A & O x 3 NAD             VS:  Filed Vitals:   12/09/11 0200 12/09/11 0230 12/09/11 0340 12/09/11 0740  BP: 122/76 110/67 103/62 117/73  Pulse: 83 115 87 60  Temp: 98.8 F (37.1 C) 98.2 F (36.8 C) 98 F (36.7 C) 98.1 F (36.7 C)  TempSrc: Oral Oral Oral Oral  Resp: 20 18 18 16   Height:      Weight:      SpO2:  95%      LABS:  Basename 12/09/11 0405 12/08/11 1045  WBC 14.0* 8.3  HGB 10.3* 12.5  HCT 31.2* 38.4  PLT 156 181    Blood type: O/Positive/-- (11/19 0000)  Rubella: Immune (11/19 0000)   I&O: I/O last 3 completed shifts: In: -  Out: 1200 [Urine:900; Blood:300]        Abdomen: soft, non-tender, non-distended              Fundus: firm, non-tender, U _1  Perineum: intact, no edema  Lochia: moderate  Extremities: no edema, no calf pain or tenderness, neg Homans    A/P: PPD # 1 21 y.o., [id number]    Active Problems:  Postpartum examination following vaginal delivery (9/17)   Doing well - stable status  Routine post partum orders  LC support for teaching and latch  Vici Novick, CNM 12/09/2011, 9:16 AM

## 2011-12-09 NOTE — Progress Notes (Signed)
S: Pushing w/ rope technique  O: Temp 99.4 axillary  VE: fully (+) 2/+3 station. Edematous vulva  Tracing: baseline 160 Ctx q 2 mins  IMP: Maternal temp w/o prolonged ROM P) defer antibiotic as anticipate vaginal delivery in a short time

## 2011-12-10 LAB — CBC
MCV: 85.2 fL (ref 78.0–100.0)
Platelets: 167 10*3/uL (ref 150–400)
RBC: 3.45 MIL/uL — ABNORMAL LOW (ref 3.87–5.11)
RDW: 13.9 % (ref 11.5–15.5)
WBC: 12 10*3/uL — ABNORMAL HIGH (ref 4.0–10.5)

## 2011-12-10 MED ORDER — IBUPROFEN 600 MG PO TABS: 600.0000 mg | ORAL_TABLET | Freq: Four times a day (QID) | ORAL | Status: AC

## 2011-12-10 MED ORDER — POLYSACCHARIDE IRON COMPLEX 150 MG PO CAPS: 150.0000 mg | ORAL_CAPSULE | Freq: Every day | ORAL | Status: AC

## 2011-12-10 NOTE — Progress Notes (Signed)
Post Partum Day #2            Information for the patient's newborn:  Sherren, Lemos [[id number]]  female   / circumcision planned outpatient Feeding: breast, latch improved w/ shield, ongoing LC support  Subjective: No HA, SOB, CP, F/C, breast symptoms. Pain minimal. Normal vaginal bleeding, no clots.      Objective:  Temp:  [98 F (36.7 C)-98.7 F (37.1 C)] 98 F (36.7 C) (09/19 0533) Pulse Rate:  [65-77] 65  (09/19 0533) Resp:  [16-18] 18  (09/19 0533) BP: (99-120)/(57-74) 99/57 mmHg (09/19 0533)  No intake or output data in the 24 hours ending 12/10/11 0917     Basename 12/10/11 0525 12/09/11 0405  WBC 12.0* 14.0*  HGB 9.6* 10.3*  HCT 29.4* 31.2*  PLT 167 156    Blood type: O/Positive/-- (11/19 0000) Rubella: Immune (11/19 0000)    Physical Exam:  General: alert, cooperative and no distress Uterine Fundus: firm Lochia: appropriate Perineum: intact, edema none DVT Evaluation: Negative Homan's sign. No significant calf/ankle edema.    Assessment/Plan: PPD # 2 / 21 y.o., G2P1011 S/P:spontaneous vaginal   Active Problems:  Postpartum examination following vaginal delivery (9/17) TDaP and flu vaccines given   normal postpartum exam  Continue current postpartum care  D/C home  [facility] support outpatient for latch   LOS: 2 days   PAUL,DANIELA, CNM, MSN 12/10/2011, 9:17 AM

## 2011-12-10 NOTE — Discharge Summary (Signed)
Obstetric Discharge Summary Reason for Admission: onset of labor Prenatal Procedures: ultrasound Intrapartum Procedures: spontaneous vaginal delivery Postpartum Procedures: TDaP and flu vaccine Complications-Operative and Postpartum: none Hemoglobin  Date Value Range Status  12/10/2011 9.6* 12.0 - 15.0 g/dL Final     HCT  Date Value Range Status  12/10/2011 29.4* 36.0 - 46.0 % Final    Physical Exam:  General: alert, cooperative and no distress Lochia: appropriate Uterine Fundus: firm Incision: na DVT Evaluation: Negative Homan's sign. No significant calf/ankle edema.  Discharge Diagnoses: Term Pregnancy-delivered and mild ABL anemia  Discharge Information: Date: 12/10/2011 Activity: pelvic rest Diet: routine Medications: PNV, Ibuprofen and Iron Condition: stable Instructions: refer to practice specific booklet Discharge to: home Follow-up Information    Follow up with COUSINS,SHERONETTE A, MD. Schedule an appointment as soon as possible for a visit in 6 weeks.   Contact information:   [address] [place] [address] [phone]          Newborn Data: Live born female  Birth Weight: 6 lb 13 oz (3090 g) APGAR: 7, 9  Home with mother.  Tanya Huffman,Tanya Huffman 12/10/2011, 9:24 AM

## 2011-12-22 ENCOUNTER — Encounter (HOSPITAL_COMMUNITY): Payer: Federal, State, Local not specified - PPO

## 2014-01-22 ENCOUNTER — Encounter (HOSPITAL_COMMUNITY): Payer: Self-pay | Admitting: *Deleted

## 2014-04-06 LAB — OB RESULTS CONSOLE GC/CHLAMYDIA
Chlamydia: NEGATIVE
GC PROBE AMP, GENITAL: NEGATIVE

## 2014-04-06 LAB — OB RESULTS CONSOLE ABO/RH: RH Type: POSITIVE

## 2014-04-06 LAB — OB RESULTS CONSOLE RUBELLA ANTIBODY, IGM: RUBELLA: IMMUNE

## 2014-04-06 LAB — OB RESULTS CONSOLE RPR: RPR: NONREACTIVE

## 2014-04-06 LAB — OB RESULTS CONSOLE HEPATITIS B SURFACE ANTIGEN: HEP B S AG: NEGATIVE

## 2014-04-06 LAB — OB RESULTS CONSOLE HIV ANTIBODY (ROUTINE TESTING): HIV: NONREACTIVE

## 2014-04-06 LAB — OB RESULTS CONSOLE ANTIBODY SCREEN: ANTIBODY SCREEN: NEGATIVE

## 2014-10-17 LAB — OB RESULTS CONSOLE GBS: STREP GROUP B AG: POSITIVE

## 2014-10-29 ENCOUNTER — Telehealth (HOSPITAL_COMMUNITY): Payer: Self-pay | Admitting: *Deleted

## 2014-10-29 ENCOUNTER — Encounter (HOSPITAL_COMMUNITY): Payer: Self-pay | Admitting: *Deleted

## 2014-10-29 NOTE — Telephone Encounter (Signed)
Preadmission screen  

## 2014-11-01 ENCOUNTER — Inpatient Hospital Stay (HOSPITAL_COMMUNITY): Admission: RE | Admit: 2014-11-01 | Payer: Federal, State, Local not specified - PPO | Source: Ambulatory Visit

## 2015-09-03 ENCOUNTER — Encounter (HOSPITAL_COMMUNITY): Payer: Self-pay | Admitting: *Deleted
